# Patient Record
Sex: Male | Born: 1990 | Hispanic: Yes | Marital: Single | Smoking: Never smoker
Health system: Southern US, Community
[De-identification: ages and names within clinical notes are randomized; demographics above are authoritative.]

---

## 2012-05-17 ENCOUNTER — Encounter (HOSPITAL_COMMUNITY): Payer: Self-pay | Admitting: Emergency Medicine

## 2012-05-17 ENCOUNTER — Emergency Department (INDEPENDENT_AMBULATORY_CARE_PROVIDER_SITE_OTHER)
Admission: EM | Admit: 2012-05-17 | Discharge: 2012-05-17 | Disposition: A | Discharge status: Home / Self Care | Payer: BC Managed Care – PPO | Source: Home / Self Care

## 2012-05-17 DIAGNOSIS — L259 Unspecified contact dermatitis, unspecified cause: Secondary | ICD-10-CM

## 2012-05-17 DIAGNOSIS — R21 Rash and other nonspecific skin eruption: Secondary | ICD-10-CM

## 2012-05-17 MED ORDER — DIPHENHYDRAMINE HCL 25 MG PO CAPS: 25.0000 mg | ORAL_CAPSULE | Freq: Four times a day (QID) | ORAL | Status: AC | PRN

## 2012-05-17 MED ORDER — TRIAMCINOLONE ACETONIDE 40 MG/ML IJ SUSP
60.0000 mg | Freq: Once | INTRAMUSCULAR | Status: AC
Start: 2012-05-17 — End: 2012-05-17
  Administered 2012-05-17: 60 mg via INTRAMUSCULAR

## 2012-05-17 MED ORDER — TRIAMCINOLONE ACETONIDE 40 MG/ML IJ SUSP
INTRAMUSCULAR | Status: AC
  Filled 2012-05-17: qty 5

## 2012-05-17 MED ORDER — TRIAMCINOLONE ACETONIDE 0.1 % EX CREA: TOPICAL_CREAM | Freq: Two times a day (BID) | CUTANEOUS | Status: AC

## 2012-05-17 NOTE — ED Notes (Signed)
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 Has had itchy rash x 5 days. Rash on arms and hands.

## 2012-05-17 NOTE — ED Provider Notes (Signed)
History     CSN: [id number]  Arrival date & time 05/17/12  1500   None     Chief Complaint  Patient presents with  . Rash    (Consider location/radiation/quality/duration/timing/severity/associated sxs/prior treatment) Patient is a 21 y.o. male presenting with rash. The history is provided by the patient.  Rash   This patient complains of a pruritic rash.  Location: bilateral forearms  Onset: 1 wk ago   Course: unchanged Self-treated with: caladyrl           Improvement with treatment: no  History Itching: yes  Tenderness: no  New medications/antibiotics: no  Pet exposure: no  Recent travel or tropical exposure: cleaning yard 1.5 week ago New soaps, shampoos, detergent, clothing: no Tick/insect exposure: no   Red Flags Feeling ill: no Fever:no Facial/tongue swelling/difficulty breathing:  no  Diabetic or immunocompromised: no       History reviewed. No pertinent past medical history.  History reviewed. No pertinent past surgical history.  No family history on file.  History  Substance Use Topics  . Smoking status: Never Smoker   . Smokeless tobacco: Not on file  . Alcohol Use: No      Review of Systems  Constitutional: Negative.   HENT: Negative.   Respiratory: Negative.   Cardiovascular: Negative.   Skin: Positive for rash. Negative for color change, pallor and wound.    Allergies  Review of patient's allergies indicates no known allergies.  Home Medications  No current outpatient prescriptions on file.  BP 122/60  Pulse 64  Temp 98.3 F (36.8 C) (Oral)  Resp 16  SpO2 100%  Physical Exam  Nursing note and vitals reviewed. Constitutional: He is oriented to person, place, and time. Vital signs are normal. He appears well-developed and well-nourished. He is active and cooperative.  HENT:  Head: Normocephalic.  Mouth/Throat: Oropharynx is clear and moist.  Eyes: Conjunctivae are normal. Pupils are equal, round, and reactive to  light. No scleral icterus.  Neck: Trachea normal and normal range of motion. Neck supple.  Cardiovascular: Normal rate and regular rhythm.   Pulmonary/Chest: Effort normal and breath sounds normal. No respiratory distress. He has no wheezes.  Neurological: He is alert and oriented to person, place, and time. No cranial nerve deficit or sensory deficit.  Skin: Skin is warm and dry. Rash noted. Rash is papular, pustular and vesicular.       Bilateral forearm  Psychiatric: He has a normal mood and affect. His speech is normal and behavior is normal. Judgment and thought content normal. Cognition and memory are normal.    ED Course  Procedures (including critical care time)  Labs Reviewed - No data to display No results found.   No diagnosis found.    MDM  Cool showers; avoid heat, sunlight and anything that makes condition worse.  Began Benadryl for at least seven days.  Use triamcinolone cream to itchy areas.  RTC if symptoms do not improve or begin to have problems swallowing, breathing or significant change in condition.        Johnsie Kindred, NP 05/17/12 1605

## 2012-05-17 NOTE — ED Provider Notes (Signed)
Medical screening examination/treatment/procedure(s) were performed by non-physician practitioner and as supervising physician I was immediately available for consultation/collaboration.  Leslee Home, M.D.   Reuben Likes, MD 05/17/12 [phone]

## 2015-01-18 ENCOUNTER — Encounter (HOSPITAL_COMMUNITY): Payer: Self-pay | Admitting: Emergency Medicine

## 2015-01-18 ENCOUNTER — Emergency Department (HOSPITAL_COMMUNITY)
Admission: EM | Admit: 2015-01-18 | Discharge: 2015-01-18 | Disposition: A | Discharge status: Home / Self Care | Payer: Self-pay | Attending: Emergency Medicine | Admitting: Emergency Medicine

## 2015-01-18 DIAGNOSIS — S91119A Laceration without foreign body of unspecified toe without damage to nail, initial encounter: Secondary | ICD-10-CM

## 2015-01-18 DIAGNOSIS — S91115A Laceration without foreign body of left lesser toe(s) without damage to nail, initial encounter: Secondary | ICD-10-CM | POA: Insufficient documentation

## 2015-01-18 DIAGNOSIS — Y9389 Activity, other specified: Secondary | ICD-10-CM | POA: Insufficient documentation

## 2015-01-18 DIAGNOSIS — Y998 Other external cause status: Secondary | ICD-10-CM | POA: Insufficient documentation

## 2015-01-18 DIAGNOSIS — Y9289 Other specified places as the place of occurrence of the external cause: Secondary | ICD-10-CM | POA: Insufficient documentation

## 2015-01-18 DIAGNOSIS — W293XXA Contact with powered garden and outdoor hand tools and machinery, initial encounter: Secondary | ICD-10-CM | POA: Insufficient documentation

## 2015-01-18 MED ORDER — LIDOCAINE HCL (PF) 1 % IJ SOLN
5.0000 mL | Freq: Once | INTRAMUSCULAR | Status: AC
  Administered 2015-01-18: 5 mL
  Filled 2015-01-18: qty 5

## 2015-01-18 NOTE — ED Notes (Signed)
Patient states was using a skill saw and accidentally caught his left foot on top of the 2nd toe.   Laceration to same.   Bleeding controlled at this time.

## 2015-01-18 NOTE — ED Provider Notes (Signed)
CSN: [id number]     Arrival date & time 01/18/15  1018 History  This chart was scribed for non-physician practitioner, Renne Crigler, PA-C working with Samuel Jester, DO by Gwenyth Ober, ED scribe. This patient was seen in room TR11C/TR11C and the patient's care was started at 10:33 AM   Chief Complaint  Patient presents with  . Extremity Laceration   The history is provided by the patient. No language interpreter was used.   HPI Comments: Oscar Lee is a 24 y.o. male who presents to the Emergency Department complaining of a subcutaneous laceration to his left 2nd toe, with controlled bleeding, that occurred 1 hour ago. Pt reports that he was using a skill saw when it went through his sneakers and hit his toe. Pt cleaned the wound with water. His last tetanus was within the last 5 years. Pt denies numbness and tingling as associated symptoms.  History reviewed. No pertinent past medical history. History reviewed. No pertinent past surgical history. No family history on file. History  Substance Use Topics  . Smoking status: Never Smoker   . Smokeless tobacco: Not on file  . Alcohol Use: Yes     Comment: socially    Review of Systems  Constitutional: Negative for activity change.  Musculoskeletal: Positive for arthralgias. Negative for back pain, joint swelling, gait problem and neck pain.  Skin: Positive for wound.  Neurological: Negative for weakness and numbness.   Allergies  Review of patient's allergies indicates no known allergies.  Home Medications   Prior to Admission medications   Medication Sig Start Date End Date Taking? Authorizing Provider  diphenhydrAMINE (BENADRYL) 25 mg capsule Take 1 capsule (25 mg total) by mouth every 6 (six) hours as needed for itching. 05/17/12 05/27/12  Johnsie Kindred, NP   BP 116/53 mmHg  Pulse 81  Temp(Src) 98.8 F (37.1 C) (Oral)  Resp 18  Ht  (1.651 m)  Wt 174 lb (78.926 kg)  BMI 28.96 kg/m2  SpO2 100% Physical  Exam  Constitutional: He appears well-developed and well-nourished. No distress.  HENT:  Head: Normocephalic and atraumatic.  Eyes: Conjunctivae and EOM are normal.  Neck: Normal range of motion. Neck supple. No tracheal deviation present.  Cardiovascular: Normal rate and normal pulses.   Pulmonary/Chest: Effort normal. No respiratory distress.  Musculoskeletal: He exhibits tenderness. He exhibits no edema.  Neurological: He is alert. No sensory deficit.  Motor, sensation, and vascular distal to the injury is fully intact. Patient with 5 out of 5 strength and normal active flexion and extension of all toes on the left foot.  Skin: Skin is warm and dry.  2 cm full-thickness laceration to the left second toe, running lengthwise, from between the MTP and PIP to between the PIP and DIP. Wound is on the medial aspect of the toe. It does extend to the muscle but does not appear to disrupt the muscular layer. No complete tendon injury noted. Wound appears clean. No foreign bodies noted, seen or palpated.  Psychiatric: He has a normal mood and affect. His behavior is normal.  Nursing note and vitals reviewed.   ED Course  Procedures   DIAGNOSTIC STUDIES: Oxygen Saturation is 100% on RA, normal by my interpretation.    COORDINATION OF CARE: 10:38 AM Discussed treatment plan with pt which includes suture repair. Pt agreed to plan.  10:45 AM LACERATION REPAIR Performed by: Renne Crigler, PA-C Consent: Verbal consent obtained. Risks and benefits: risks, benefits and alternatives were discussed Patient identity confirmed:  provided demographic data Time out performed prior to procedure Prepped and Draped in normal sterile fashion Wound explored Laceration Location: Left 2nd toe Laceration Length: 2 cm No Foreign Bodies seen or palpated Anesthesia: local infiltration Local anesthetic: lidocaine 1% w/o epinephrine Anesthetic total: 4 ml Irrigation method: syringe Amount of cleaning:  standard Skin closure: Tight with edges approximated Number of sutures or staples: 5 Technique: 5-O Ethilon  Patient tolerance: Patient tolerated the procedure well with no immediate complications.  11:00 AM Discussed wound care with pt. Advised pt to return with purulent drainage, increased redness or swelling. Pt to return in 10 days for suture removal.    Labs Review Labs Reviewed - No data to display  Imaging Review No results found.   EKG Interpretation None       Vital signs reviewed and are as follows: Filed Vitals:   01/18/15 1026  BP: 116/53  Pulse: 81  Temp: 98.8 F (37.1 C)  Resp: 18      MDM   Final diagnoses:  Laceration of toe, initial encounter   Toe laceration, cleaned, repaired. Tetanus UTD. No neuro, vascular, or muscular disruption suspected.   I personally performed the services described in this documentation, which was scribed in my presence. The recorded information has been reviewed and is accurate.    Renne CriglerJoshua Rai Severns, PA-C 01/18/15 1132  Samuel JesterKathleen McManus, DO 01/19/15 [phone]

## 2015-01-18 NOTE — Discharge Instructions (Signed)
Please read and follow all provided instructions.  Your diagnoses today include:  1. Laceration of toe, initial encounter     Tests performed today include:  Vital signs. See below for your results today.   Medications prescribed:   None  Take any prescribed medications only as directed.   Home care instructions:  Follow any educational materials and wound care instructions contained in this packet.   Keep affected area above the level of your heart when possible to minimize swelling. Wash area gently twice a day with warm soapy water. Do not apply alcohol or hydrogen peroxide. Cover the area if it draining or weeping.   Follow-up instructions: Suture Removal: Return to the Emergency Department or see your primary care care doctor in 10 days for a recheck of your wound and removal of your sutures or staples.    Return instructions:  Return to the Emergency Department if you have:  Fever  Worsening pain  Worsening swelling of the wound  Pus draining from the wound  Redness of the skin that moves away from the wound, especially if it streaks away from the affected area   Any other emergent concerns  Your vital signs today were: BP 116/53 mmHg   Pulse 81   Temp(Src) 98.8 F (37.1 C) (Oral)   Resp 18   Ht 5\' 5"  (1.651 m)   Wt 174 lb (78.926 kg)   BMI 28.96 kg/m2   SpO2 100% If your blood pressure (BP) was elevated above 135/85 this visit, please have this repeated by your doctor within one month. --------------

## 2015-02-01 ENCOUNTER — Encounter (HOSPITAL_COMMUNITY): Payer: Self-pay | Admitting: Emergency Medicine

## 2015-02-01 ENCOUNTER — Emergency Department (HOSPITAL_COMMUNITY)
Admission: EM | Admit: 2015-02-01 | Discharge: 2015-02-01 | Disposition: A | Discharge status: Home / Self Care | Payer: Self-pay | Attending: Emergency Medicine | Admitting: Emergency Medicine

## 2015-02-01 DIAGNOSIS — Z4802 Encounter for removal of sutures: Secondary | ICD-10-CM | POA: Insufficient documentation

## 2015-02-01 DIAGNOSIS — M79675 Pain in left toe(s): Secondary | ICD-10-CM | POA: Insufficient documentation

## 2015-02-01 MED ORDER — BACITRACIN ZINC 500 UNIT/GM EX OINT: 1.0000 "application " | TOPICAL_OINTMENT | Freq: Two times a day (BID) | CUTANEOUS | Status: AC

## 2015-02-01 NOTE — Discharge Instructions (Signed)
Read the information below.  You may return to the Emergency Department at any time for worsening condition or any new symptoms that concern you.  If you develop redness, swelling, pus draining from the wound, or fevers greater than 100.4, return to the ER immediately for a recheck.     Suture Removal, Care After Refer to this sheet in the next few weeks. These instructions provide you with information on caring for yourself after your procedure. Your health care provider may also give you more specific instructions. Your treatment has been planned according to current medical practices, but problems sometimes occur. Call your health care provider if you have any problems or questions after your procedure. WHAT TO EXPECT AFTER THE PROCEDURE After your stitches (sutures) are removed, it is typical to have the following:  Some discomfort and swelling in the wound area.  Slight redness in the area. HOME CARE INSTRUCTIONS   If you have skin adhesive strips over the wound area, do not take the strips off. They will fall off on their own in a few days. If the strips remain in place after 14 days, you may remove them.  Change any bandages (dressings) at least once a day or as directed by your health care provider. If the bandage sticks, soak it off with warm, soapy water.  Apply cream or ointment only as directed by your health care provider. If using cream or ointment, wash the area with soap and water 2 times a day to remove all the cream or ointment. Rinse off the soap and pat the area dry with a clean towel.  Keep the wound area dry and clean. If the bandage becomes wet or dirty, or if it develops a bad smell, change it as soon as possible.  Continue to protect the wound from injury.  Use sunscreen when out in the sun. New scars become sunburned easily. SEEK MEDICAL CARE IF:  You have increasing redness, swelling, or pain in the wound.  You see pus coming from the wound.  You have a  fever.  You notice a bad smell coming from the wound or dressing.  Your wound breaks open (edges not staying together). Document Released: 06/16/2001 Document Revised: 07/12/2013 Document Reviewed: 05/03/2013 [facility]ExitCare Patient Information 2015 [place]. This information is not intended to replace advice given to you by your health care provider. Make sure you discuss any questions you have with your health care provider.    Emergency Department Resource Guide 1) Find a Doctor and Pay Out of Pocket Although you won't have to find out who is covered by your insurance plan, it is a good idea to ask around and get recommendations. You will then need to call the office and see if the doctor you have chosen will accept you as a new patient and what types of options they offer for patients who are self-pay. Some doctors offer discounts or will set up payment plans for their patients who do not have insurance, but you will need to ask so you aren't surprised when you get to your appointment.  2) Contact Your Local Health Department Not all health departments have doctors that can see patients for sick visits, but many do, so it is worth a call to see if yours does. If you don't know where your local health department is, you can check in your phone book. The CDC also has a tool to help you locate your state's health department, and many state websites also have  listings of all of their local health departments.  3) Find a Walk-in Clinic If your illness is not likely to be very severe or complicated, you may want to try a walk in clinic. These are popping up all over the country in pharmacies, drugstores, and shopping centers. They're usually staffed by nurse practitioners or physician assistants that have been trained to treat common illnesses and complaints. They're usually fairly quick and inexpensive. However, if you have serious medical issues or chronic medical problems, these are probably not  your best option.  No Primary Care Doctor: - Call Health Connect at  [phone] - they can help you locate a primary care doctor that  accepts your insurance, provides certain services, etc. - Physician Referral Service- [phone]  Chronic Pain Problems: Organization         Address  Phone   Notes  [facility]  [phone] Patients need to be referred by their primary care doctor.   Medication Assistance: Organization         Address  Phone   Notes  [facility] Roshun Klingensmith Medication [facility] [address] [place], [place] [address] [phone] --Must be a resident of [facility] -- Must have NO insurance coverage whatsoever (no Medicaid/ Medicare, etc.) -- The pt. MUST have a primary care doctor that directs their care regularly and follows them in the community   [facility]ist  [phone]   [employer]  [phone]    Agencies that provide inexpensive medical care: Organization         Address  Phone   Notes  [facility]  [phone]   [facility]    [phone]   [facility] [address] [place], [place] [address] [phone]   [facility] [address] [place] [place] [phone]   [facility]    [phone]   [facility]    [phone]   [facility]  [address], [place] Phone:  [phone], Fax:  [phone] Hours of Operation:  9 am - 6 pm, M-F.  Also accepts Medicaid/Medicare and self-pay.  [facility]  [address], [place] Phone: [phone], Fax: [phone]. Hours of Operation:  8:30 am - 5:30 pm, M-F.  Also accepts Medicaid and self-pay.  [facility] [place] [address], [place] Phone: [phone]   [facility] [address] Natasha Bence [place], [place] [phone], Ext. [phone] Mondays & Thursdays: 7-9 AM.  First  15 patients are seen on a first come, first serve basis.    Medicaid-accepting [facility] Providers:  Organization         Address  Phone   Notes  [facility] [address], [place] [phone] Also accepts self-pay patients.  [facility] [address] Laurell Josephs [place], [place]  [phone]   [facility] [address], [place] [phone]   [facility] [address] [place] [phone]   Renaye Rakers [address], [place]   [phone] Only accepts [place] patients after they have their name applied to their card.   Self-Pay (no insurance) in [facility]:  [employer]  Sickle Cell Patients, [facility] [address], [place] [phone]   °[facility] [address], [place] ([phone]) [phone]   °[facility] ° [address], [place] ([phone]) [phone]   °[facility]/Dr. Osei-Bonsu ° [address] [place] [address], [place] or [address], [place] [phone]) [phone] Phone number for both [place] and [place] locations is the same.  °[facility] [address], [place] ([phone]) [phone]   °[facility] [address] [place] [address], [place] or [address] ([phone]) [phone] °(336) [phone]   °[facility] [address], [place] (336) [phone], phone; [phone], fax Sees patients 1st and 3rd Saturday of every month.  Must not qualify for public or private insurance (i.e. Medicaid, Medicare, [place] Health Choice, Veterans' Benefits) • Household income should be no more than 200% of the poverty level •The clinic cannot treat you if you are pregnant or think you are pregnant • Sexually transmitted diseases are not treated at the clinic.  ° ° °Dental  Care: °Organization         Address  Phone  Notes  °[facility] [address], [place] [phone]) [phone] Accepts children up to age 21 who are enrolled in Medicaid or [place] Health Choice; pregnant women with a Medicaid card; and children who have applied for Medicaid or [place] Health Choice, but were declined, whose parents can pay a reduced fee at time of service.  °[facility] [place]  [address], [place] ([phone]) [phone] Accepts children up to age 21 who are enrolled in Medicaid or [place] Health Choice; pregnant women with a Medicaid card; and children who have applied for Medicaid or [place] Health Choice, but were declined, whose parents can pay a reduced fee at time of service.  °[facility] ° [address], [place] ([phone]) [phone] Patients are seen by appointment only. Walk-ins are not accepted. [facility] will see patients 18 years of age and older. °Monday - Tuesday (8am-5pm) °Most Wednesdays (8:30-5pm) °$30 per visit, cash only  °[facility] ° [address], [place] ([phone]) [phone] Patients are seen by appointment only. Walk-ins are not accepted. [facility] will see patients 18 years of age and older. °One Wednesday Evening (Monthly: Volunteer Based).  $30 per visit, cash only  °[facility]  ([phone] for adults; Children under age 4, call Graduate Pediatric Dentistry at [phone]. Children aged 4-14, please call [phone] to request a pediatric application. ° Dental services are provided in all areas of dental care including fillings, crowns and bridges, complete and partial dentures, implants, gum treatment, root canals, and extractions. Preventive care is also provided. Treatment is provided to both adults and children. °Patients are selected via a lottery and there is often a waiting list. °  °[facility] [address], °[place] ° (336) 763-8833 [email or web address].[email or web address] °  °[facility] [address], [place], [place] (336)[phone], Ext. [phone] Second and Fourth Thursday of each month, opens at 6:30 AM; Clinic ends at 9 AM.  Patients are seen on a first-come first-served basis, and a limited number are seen during each clinic.  ° °[facility] ° [address], [place], [place] ([phone]) [phone]   Eligibility   Requirements °You must have lived in [place], [place], or [place] counties for at least the last three months. °  You cannot be eligible for state or federal sponsored healthcare insurance, including Veterans Administration, Medicaid, or Medicare. °  You generally cannot be eligible for healthcare insurance through your employer.  °  How to apply: °Eligibility screenings are held every Tuesday and Wednesday afternoon from 1:00 pm until 4:00 pm. You do not need an appointment for the interview!  °[facility] [address], [place], [place] [phone]   °[facility]  336-342-8273   °[facility]  336-703-3100   °[facility]  336-570-6415   ° °Behavioral Health Resources in the Community: °Intensive Outpatient Programs °Organization         Address  Phone  Notes  °[facility] [address], [place], [place] [phone]   °[facility] [address], [place], [place] 336-832-9800   °[facility] [address], [place], [place] ° 336-882-2125   °[facility] [address],  °[place], [place] 1-800-853-5163 or 336-641-4981   °Substance Abuse Resources °Organization         Address  Phone  Notes  °[facility]  336-882-2125   °[facility]  336-784-9470   °[facility]  336-285-9073   °[facility]  336-845-3988   °Residential & Outpatient Substance Abuse Program  1-800-659-3381    °Psychological Services °Organization         Address  Phone  Notes  °[facility]  336- 832-9600   °[facility]  336- 378-7881   °[facility] [address], [place] 1-800-853-5163 or 336-641-4981   ° °Mobile Crisis Teams °Organization         Address  Phone  Notes  °[facility], Mobile Crisis Care Unit  1-877-626-1772   °Assertive °[facility] ° [address] [place], [place] 336-834-9664   °Sharon DeEsch [address] °[place] 336-554-5454   ° °Self-Help/Support Groups °Organization         Address  Phone             Notes  °[facility] [place] - variety of support groups  336- 373-1402 Call for more information  °Narcotics Anonymous (NA), [facility] [address], °[place]  2 meetings at this location  ° °Residential Treatment Programs °Organization         Address  Phone  Notes  °[facility] [address],    °[place]  1-866-801-8205   °[facility] ° [address], [place], [place] 704-293-8524   °[facility] [address], [place] 336-845-3988 Admissions: 8am-3pm M-F  °[facility] [address]    °[place], [place] 336-841-1104   °[facility] [address], [place], [place] 336-379-7146   °[facility] [address]  °[place], [place] 336-285-9073   °[facility] Intensive Outpatient [address], [place], [place] 336-852-3033   °[facility] [address]  °[place], [place] 1-877-615-2722 or 336-784-9470   °[facility] ([facility]) [address] [place], [place] 336-227-7417 Accepts Medicaid  °[facility] [address]  °[place]  1-800-659-3381 Substance Abuse/Addiction Treatment  ° °[place] [facility] °Organization           Address  Phone  Notes  °[facility]  (888) 581-9988   °Julie Brannon, PhD [address] [place], [place]   (336) 349-5553 or (336) 951-0000   °[facility]   [address] °[place], [place] (336) 349-4454   °[facility] [address], [place], [place] (336) 342-8316 Insurance/Medicaid/sponsorship through [facility]  °[facility] [address]                                    [place], [place] (336) 342-8316 Therapy/tele-psych/case  °[facility] [address]  ° [place], [place] (336) 349-2233    °Dr. Arfeen  (336) 349-4544   °[facility] [place] [facility]  [employer] [facility] 1) [address], [place] °2) [address], [place] °3)  [address], [place] (336) 349-3220 °(336) 342-7768 ° °(336) 342-8140   °[place] [facility] (336) 342-1394 or (336) 342-3537 (After Hours)    ° ° ° °

## 2015-02-01 NOTE — ED Notes (Signed)
Request suture removal- L 2nd toe.  States sutures have been in place x 2 weeks.

## 2015-02-01 NOTE — ED Provider Notes (Signed)
CSN: [id number]     Arrival date & time 02/01/15  2014 History  This chart was scribed for non-physician practitioner, Trixie Dredge, PA-C, working with Linwood Dibbles, MD, by Bronson Curb, ED Scribe. This patient was seen in room TR06C/TR06C and the patient's care was started at 8:58 PM.     Chief Complaint  Patient presents with  . Suture / Staple Removal    The history is provided by the patient. No language interpreter was used.     HPI Comments: Oscar Lee is a 24 y.o. male, with no significant medical history, who presents to the Emergency Department for a suture removal. Patient was seen 2 weeks ago, on 4/15, for a laceration  of left second toe that occurred when using a skill saw. He reports the wound is healing slowly, but without complications. He reports pain to the area that is painful only upon waking in the mornings. He also mentions itching, occasional swelling, in addition to bleeding every 2 days. Patient states he keeps the wound covered, but has not applied any topical creams and denies soaking the foot. He further denies left ankle pain, fever, chills, body aches, nausea, or vomiting.    History reviewed. No pertinent past medical history. History reviewed. No pertinent past surgical history. No family history on file. History  Substance Use Topics  . Smoking status: Never Smoker   . Smokeless tobacco: Not on file  . Alcohol Use: Yes     Comment: socially    Review of Systems  Constitutional: Negative for fever and chills.  Gastrointestinal: Negative for nausea and vomiting.  Musculoskeletal: Positive for myalgias. Negative for arthralgias.  Skin: Positive for wound (healing laceration). Negative for color change.  Allergic/Immunologic: Negative for immunocompromised state.  Neurological: Negative for weakness and numbness.  Hematological: Does not bruise/bleed easily.      Allergies  Review of patient's allergies indicates no known allergies.  Home  Medications   Prior to Admission medications   Medication Sig Start Date End Date Taking? Authorizing Provider  diphenhydrAMINE (BENADRYL) 25 mg capsule Take 1 capsule (25 mg total) by mouth every 6 (six) hours as needed for itching. 05/17/12 05/27/12  Johnsie Kindred, NP   Triage Vitals: BP 127/77 mmHg  Pulse 87  Temp(Src) 97.8 F (36.6 C) (Oral)  Resp 16  Ht  (1.626 m)  Wt 175 lb (79.379 kg)  BMI 30.02 kg/m2  SpO2 98%  Physical Exam  Constitutional: He appears well-developed and well-nourished. No distress.  HENT:  Head: Normocephalic and atraumatic.  Eyes: Conjunctivae are normal.  Neck: Neck supple.  Pulmonary/Chest: Effort normal.  Neurological: He is alert.  Skin: He is not diaphoretic. No erythema.  Maceration of the skin between the 1st and 2nd toe of the left foot. No erythema, edema, discharge, or warmth involving the laceration.  Psychiatric: He has a normal mood and affect. His behavior is normal.  Nursing note and vitals reviewed.   ED Course  Procedures (including critical care time)  DIAGNOSTIC STUDIES: Oxygen Saturation is 98% on room air, normal by my interpretation.    COORDINATION OF CARE: At 2102 Discussed treatment plan with patient which includes suture removal and ABX ointment. Patient agrees.   Labs Review Labs Reviewed - No data to display  Imaging Review No results found.   EKG Interpretation None     SUTURE REMOVAL Performed by: Trixie Dredge  Consent: Verbal consent obtained. Patient identity confirmed: provided demographic data Time out: Immediately prior to procedure  a "time out" was called to verify the correct patient, procedure, equipment, support staff and site/side marked as required.  Location details: left second toe  Wound Appearance: clean  Sutures/Staples Removed: 5  Facility: sutures placed in this facility Patient tolerance: Patient tolerated the procedure well with no immediate complications.    MDM    Final diagnoses:  Visit for suture removal    Afebrile, nontoxic patient with visit for suture removal.  No e/o wound infection.  Small part of intertriginous area is macerated, suspect overly moist from bandages.  Discussed wound care with the patient.   D/C home with wound care instructions, return precautions.   Discussed result, findings, treatment, and follow up  with patient.  Pt given return precautions.  Pt verbalizes understanding and agrees with plan.       I personally performed the services described in this documentation, which was scribed in my presence. The recorded information has been reviewed and is accurate.    Trixie Dredgemily Lynnex Fulp, PA-C 02/01/15 2115  Linwood DibblesJon Knapp, MD 02/02/15 [phone]

## 2015-02-01 NOTE — ED Notes (Signed)
Pt A&OX4, ambulatory at d/c with steady gait, NAD 

## 2017-12-09 ENCOUNTER — Emergency Department (HOSPITAL_COMMUNITY)
Admission: EM | Admit: 2017-12-09 | Discharge: 2017-12-09 | Disposition: A | Discharge status: Home / Self Care | Payer: Self-pay | Attending: Emergency Medicine | Admitting: Emergency Medicine

## 2017-12-09 ENCOUNTER — Emergency Department (HOSPITAL_COMMUNITY): Payer: Self-pay

## 2017-12-09 ENCOUNTER — Other Ambulatory Visit: Payer: Self-pay

## 2017-12-09 ENCOUNTER — Encounter (HOSPITAL_COMMUNITY): Payer: Self-pay | Admitting: Emergency Medicine

## 2017-12-09 DIAGNOSIS — Z79899 Other long term (current) drug therapy: Secondary | ICD-10-CM | POA: Insufficient documentation

## 2017-12-09 DIAGNOSIS — Y990 Civilian activity done for income or pay: Secondary | ICD-10-CM | POA: Insufficient documentation

## 2017-12-09 DIAGNOSIS — S76112A Strain of left quadriceps muscle, fascia and tendon, initial encounter: Secondary | ICD-10-CM | POA: Insufficient documentation

## 2017-12-09 DIAGNOSIS — X503XXA Overexertion from repetitive movements, initial encounter: Secondary | ICD-10-CM | POA: Insufficient documentation

## 2017-12-09 DIAGNOSIS — Y9389 Activity, other specified: Secondary | ICD-10-CM | POA: Insufficient documentation

## 2017-12-09 DIAGNOSIS — M25562 Pain in left knee: Secondary | ICD-10-CM | POA: Insufficient documentation

## 2017-12-09 DIAGNOSIS — Y9289 Other specified places as the place of occurrence of the external cause: Secondary | ICD-10-CM | POA: Insufficient documentation

## 2017-12-09 MED ORDER — IBUPROFEN 600 MG PO TABS: 600.0000 mg | ORAL_TABLET | Freq: Four times a day (QID) | ORAL | 0 refills | Status: AC | PRN

## 2017-12-09 MED ORDER — DICLOFENAC SODIUM 1 % TD GEL: 2.0000 g | Freq: Four times a day (QID) | TRANSDERMAL | 0 refills | Status: AC

## 2017-12-09 NOTE — ED Notes (Signed)
See EDP secondary assessment.  

## 2017-12-09 NOTE — ED Triage Notes (Signed)
Pt c/o 9/10 left knee pain for the past few days getting worse today, pt denies any injury.

## 2017-12-09 NOTE — ED Notes (Signed)
ACE wrap applied to L knee. Patient tolerated well.

## 2017-12-09 NOTE — ED Provider Notes (Signed)
[facility]   CSN: [id number] Arrival date & time: 12/09/17  [id number]     History   Chief Complaint Chief Complaint  Patient presents with  . Knee Pain    HPI Oscar Lee is a 27 y.o. male with no past medical history presenting with progressive onset sharp left upper knee pain radiating up into his anterior quads and worsening over the last couple of days.  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  He typically uses his right knee to hit the kick board and on rest on his body weight on his left knee.  He has tried icy hot and Advil 2 days ago with modest relief.  He reports that this morning he was having a hard time sleeping due to the discomfort and decided to come in to be evaluated.  He denies any direct injury to the knee or trauma.  He denies any swelling, numbness, redness, fever. Patient does not currently have a primary care provider.  HPI  History reviewed. No pertinent past medical history.  There are no active problems to display for this patient.   History reviewed. No pertinent surgical history.     Home Medications    Prior to Admission medications   Medication Sig Start Date End Date Taking? Authorizing Provider  bacitracin ointment Apply 1 application topically 2 (two) times daily. 02/01/15   Trixie DredgeWest, Emily, PA-C  diclofenac sodium (VOLTAREN) 1 % GEL Apply 2 g topically 4 (four) times daily. 12/09/17   Georgiana ShoreMitchell, Jessica B, PA-C  diphenhydrAMINE (BENADRYL) 25 mg capsule Take 1 capsule (25 mg total) by mouth every 6 (six) hours as needed for itching. 05/17/12 05/27/12  Chatten, Katherine Bassetarmen L, NP  ibuprofen (ADVIL,MOTRIN) 600 MG tablet Take 1 tablet (600 mg total) by mouth every 6 (six) hours as needed. 12/09/17   Georgiana ShoreMitchell, Jessica B, PA-C    Family History History reviewed. No pertinent family history.  Social History Social History   Tobacco Use  . Smoking status:  Never Smoker  . Smokeless tobacco: Never Used  Substance Use Topics  . Alcohol use: Yes    Comment: socially  . Drug use: No     Allergies   Patient has no known allergies.   Review of Systems Review of Systems  Constitutional: Negative for diaphoresis and fever.  Respiratory: Negative for cough, choking, chest tightness, shortness of breath, wheezing and stridor.   Cardiovascular: Negative for chest pain, palpitations and leg swelling.  Gastrointestinal: Negative for nausea and vomiting.  Genitourinary: Negative for difficulty urinating, dysuria and hematuria.  Musculoskeletal: Positive for arthralgias and myalgias. Negative for back pain, gait problem, joint swelling, neck pain and neck stiffness.  Skin: Negative for color change, pallor and wound.  Neurological: Negative for weakness and numbness.     Physical Exam Updated Vital Signs BP 121/70 (BP Location: Right Arm)   Pulse 73   Temp 98.2 F (36.8 C) (Oral)   Resp 16   Ht 5\' 4"  (1.626 m)   Wt 83.5 kg (184 lb)   SpO2 98%   BMI 31.58 kg/m   Physical Exam  Constitutional: He appears well-developed and well-nourished. No distress.  Well-appearing, nontoxic afebrile sitting comfortably in bed no acute distress.  HENT:  Head: Normocephalic and atraumatic.  Eyes: Conjunctivae are normal.  Neck: Neck supple.  Cardiovascular: Normal rate, regular rhythm, normal heart sounds and intact distal pulses.  Pulmonary/Chest: Effort normal and breath  sounds normal. No stridor. No respiratory distress. He has no wheezes. He has no rales.  Musculoskeletal: Normal range of motion. He exhibits no edema, tenderness or deformity.  Patient with full range of motion of the knee, no problems with weightbearing or ambulating.  No erythema, edema or warmth.  Neurological: He is alert. No sensory deficit. He exhibits normal muscle tone.  5 out of 5 strength to flexion/ extension at the knee and plantar flexion dorsiflexion.  Sensation  intact. Vascularly intact.  Normal stance and gait.  Skin: Skin is warm and dry. No rash noted. He is not diaphoretic. No erythema. No pallor.  Psychiatric: He has a normal mood and affect.  Nursing note and vitals reviewed.    ED Treatments / Results  Labs (all labs ordered are listed, but only abnormal results are displayed) Labs Reviewed - No data to display  EKG  EKG Interpretation None       Radiology Dg Knee 2 Views Left  Result Date: 12/09/2017 CLINICAL DATA:  Left knee pain for 3 days.  No known injury. EXAM: LEFT KNEE - 2 VIEW COMPARISON:  None. FINDINGS: No evidence of fracture, dislocation, or joint effusion. No evidence of arthropathy or other focal bone abnormality. Soft tissues are unremarkable. IMPRESSION: Negative. Electronically Signed   By: Myles Rosenthal M.D.   On: 12/09/2017 07:08    Procedures Procedures (including critical care time)  Medications Ordered in ED Medications - No data to display   Initial Impression / Assessment and Plan / ED Course  I have reviewed the triage vital signs and the nursing notes.  Pertinent labs & imaging results that were available during my care of the patient were reviewed by me and considered in my medical decision making (see chart for details).     Presented with a few days of progressive onset left knee pain.  The pain is not reproducible on palpation and is deep, located over the patellar tendon and radiating into the quads.  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 Possibly patellar tendon strain. Plain films negative for acute fracture, dislocation or effusion.  Reassuring exam, full range of motion, no swelling, erythema, warmth, patient is afebrile nontoxic with normal vital signs.  Will discharge home with symptomatic relief and close follow-up with PCP.  Advised rice protocol and Ace wrap to help support the quadriceps.  Discussed return precautions,  patient understood and agrees with discharge plan.  Final Clinical Impressions(s) / ED Diagnoses   Final diagnoses:  Acute pain of left knee  Strain of left quadriceps, initial encounter    ED Discharge Orders        Ordered    diclofenac sodium (VOLTAREN) 1 % GEL  4 times daily     12/09/17 0945    ibuprofen (ADVIL,MOTRIN) 600 MG tablet  Every 6 hours PRN     12/09/17 0945       Georgiana Shore, PA-C 12/09/17 4098    Cathren Laine, MD 12/09/17 1353

## 2017-12-09 NOTE — Discharge Instructions (Signed)
As discussed, make sure to use knee pads when at work and wear an Ace wrap to help support your upper thigh.  Apply diclofenac gel as needed and take ibuprofen every 6 hours for pain. Try to get some rest and protect the area from further injury.  Follow up with your primary care provider if symptoms beyond a week.

## 2018-01-14 ENCOUNTER — Encounter (HOSPITAL_COMMUNITY): Payer: Self-pay

## 2018-01-14 ENCOUNTER — Emergency Department (HOSPITAL_COMMUNITY): Payer: Self-pay

## 2018-01-14 ENCOUNTER — Emergency Department (HOSPITAL_COMMUNITY)
Admission: EM | Admit: 2018-01-14 | Discharge: 2018-01-15 | Disposition: A | Discharge status: Home / Self Care | Payer: Self-pay | Attending: Emergency Medicine | Admitting: Emergency Medicine

## 2018-01-14 ENCOUNTER — Other Ambulatory Visit: Payer: Self-pay

## 2018-01-14 DIAGNOSIS — R55 Syncope and collapse: Secondary | ICD-10-CM | POA: Insufficient documentation

## 2018-01-14 DIAGNOSIS — Z79899 Other long term (current) drug therapy: Secondary | ICD-10-CM | POA: Insufficient documentation

## 2018-01-14 LAB — CBC
HCT: 43.3 % (ref 39.0–52.0)
Hemoglobin: 15.7 g/dL (ref 13.0–17.0)
MCH: 30.1 pg (ref 26.0–34.0)
MCHC: 36.3 g/dL — AB (ref 30.0–36.0)
MCV: 83.1 fL (ref 78.0–100.0)
Platelets: 242 10*3/uL (ref 150–400)
RBC: 5.21 MIL/uL (ref 4.22–5.81)
RDW: 13.1 % (ref 11.5–15.5)
WBC: 12.2 10*3/uL — ABNORMAL HIGH (ref 4.0–10.5)

## 2018-01-14 LAB — BASIC METABOLIC PANEL
ANION GAP: 9 (ref 5–15)
BUN: 15 mg/dL (ref 6–20)
CALCIUM: 8.9 mg/dL (ref 8.9–10.3)
CHLORIDE: 104 mmol/L (ref 101–111)
CO2: 23 mmol/L (ref 22–32)
Creatinine, Ser: 1.22 mg/dL (ref 0.61–1.24)
GFR calc Af Amer: >60 mL/min (ref >60)
GFR calc non Af Amer: >60 mL/min (ref >60)
GLUCOSE: 100 mg/dL — AB (ref 65–99)
Potassium: 3.7 mmol/L (ref 3.5–5.1)
Sodium: 136 mmol/L (ref 135–145)

## 2018-01-14 LAB — I-STAT TROPONIN, ED: TROPONIN I, POC: 0 ng/mL (ref 0.00–0.08)

## 2018-01-14 NOTE — ED Triage Notes (Signed)
Pt endorses driving home from work and became suddenly dizzy, began shaking all over, and having left sided chest pain with left arm pain. Denies n/v.

## 2018-01-14 NOTE — ED Provider Notes (Signed)
[facility] EMERGENCY DEPARTMENT Provider Note   CSN: [id number] Arrival date & time: 01/14/18  1820     History   Chief Complaint Chief Complaint  Patient presents with  . Near Syncope  . Chest Pain    HPI Oscar Lee is a 27 y.o. male.  27 yo M with a chief complaint of a near syncopal event.  The patient was driving down the road after leaving his work when he felt uncomfortable he started seeing spots and had to pull his car over.  He started hyperventilating and had pressure to the left side of his chest and down his left arm.  He called for a ride and they took him home.  When he got home he decided to come to the emergency department.  He is no longer having the symptoms.  Denies any exertional chest pain or shortness of breath.  Denies abdominal pain vomiting or diarrhea.  He denies hemoptysis leg swelling recent surgery or hospitalization.  Denies testosterone use.  He tells me that now he has some low abdominal discomfort.  He also has had a lump on his testicle for about a year and a half.  has not yet seen a provider for this.  The history is provided by the patient.  Illness  This is a new problem. The current episode started less than 1 hour ago. The problem occurs constantly. The problem has not changed since onset.Associated symptoms include chest pain and shortness of breath. Pertinent negatives include no abdominal pain and no headaches. Nothing aggravates the symptoms. Nothing relieves the symptoms. He has tried nothing for the symptoms. The treatment provided no relief.    History reviewed. No pertinent past medical history.  There are no active problems to display for this patient.   History reviewed. No pertinent surgical history.      Home Medications    Prior to Admission medications   Medication Sig Start Date End Date Taking? Authorizing Provider  bacitracin ointment Apply 1 application topically 2 (two) times daily.  02/01/15   Trixie Dredge, PA-C  diclofenac sodium (VOLTAREN) 1 % GEL Apply 2 g topically 4 (four) times daily. 12/09/17   Georgiana Shore, PA-C  diphenhydrAMINE (BENADRYL) 25 mg capsule Take 1 capsule (25 mg total) by mouth every 6 (six) hours as needed for itching. 05/17/12 05/27/12  Chatten, Katherine Basset, NP  ibuprofen (ADVIL,MOTRIN) 600 MG tablet Take 1 tablet (600 mg total) by mouth every 6 (six) hours as needed. 12/09/17   Georgiana Shore PA-C    Family History History reviewed. No pertinent family history.  Social History Social History   Tobacco Use  . Smoking status: Never Smoker  . Smokeless tobacco: Never Used  Substance Use Topics  . Alcohol use: Yes    Comment: socially  . Drug use: No     Allergies   Patient has no known allergies.   Review of Systems Review of Systems  Constitutional: Negative for chills and fever.  HENT: Negative for congestion and facial swelling.   Eyes: Negative for discharge and visual disturbance.  Respiratory: Positive for shortness of breath.   Cardiovascular: Positive for chest pain. Negative for palpitations.  Gastrointestinal: Negative for abdominal pain, diarrhea and vomiting.  Musculoskeletal: Negative for arthralgias and myalgias.  Skin: Negative for color change and rash.  Neurological: Positive for syncope (near). Negative for tremors and headaches.  Psychiatric/Behavioral: Negative for confusion and dysphoric mood.     Physical Exam Updated Vital Signs  BP 130/78 (BP Location: Right Arm)   Pulse 76   Temp 98.5 F (36.9 C) (Oral)   Resp 18   Ht 5\' 4"  (1.626 m)   Wt 83.9 kg (185 lb)   SpO2 99%   BMI 31.76 kg/m   Physical Exam  Constitutional: He is oriented to person, place, and time. He appears well-developed and well-nourished.  HENT:  Head: Normocephalic and atraumatic.  Eyes: Pupils are equal, round, and reactive to light. EOM are normal.  Neck: Normal range of motion. Neck supple. No JVD present.    Cardiovascular: Normal rate and regular rhythm. Exam reveals no gallop and no friction rub.  No murmur heard. Pulmonary/Chest: No respiratory distress. He has no wheezes.  Abdominal: He exhibits no distension and no mass. There is no tenderness. There is no rebound and no guarding.  Musculoskeletal: Normal range of motion.  Neurological: He is alert and oriented to person, place, and time.  Skin: No rash noted. No pallor.  Psychiatric: He has a normal mood and affect. His behavior is normal.  Nursing note and vitals reviewed.    ED Treatments / Results  Labs (all labs ordered are listed, but only abnormal results are displayed) Labs Reviewed  BASIC METABOLIC PANEL - Abnormal; Notable for the following components:      Result Value   Glucose, Bld 100 (*)    All other components within normal limits  CBC - Abnormal; Notable for the following components:   WBC 12.2 (*)    MCHC 36.3 (*)    All other components within normal limits  I-STAT TROPONIN, ED    EKG EKG Interpretation  Date/Time:  Friday January 14 2018 18:25:59 EDT Ventricular Rate:  99 PR Interval:  164 QRS Duration: 108 QT Interval:  350 QTC Calculation: 449 R Axis:   49 Text Interpretation:  Normal sinus rhythm with sinus arrhythmia Normal ECG No significant change since last tracing Confirmed by Melene PlanFloyd, Aiyden Lauderback [phone]([id number]) on 01/14/2018 11:30:34 PM   Radiology Dg Chest 2 View  Result Date: 01/14/2018 CLINICAL DATA:  Chest pain EXAM: CHEST - 2 VIEW COMPARISON:  None. FINDINGS: The heart size and mediastinal contours are within normal limits. Both lungs are clear. The visualized skeletal structures are unremarkable. IMPRESSION: No active cardiopulmonary disease. Electronically Signed   By: Jasmine PangKim  Fujinaga M.D.   On: 01/14/2018 18:53    Procedures Procedures (including critical care time)  Medications Ordered in ED Medications - No data to display   Initial Impression / Assessment and Plan / ED Course  I have reviewed  the triage vital signs and the nursing notes.  Pertinent labs & imaging results that were available during my care of the patient were reviewed by me and considered in my medical decision making (see chart for details).     27 yo M with a chief complaint of a near syncopal event.  Sounds vasovagal by history.  He is PERC negative.  Sounds completely atypical of ACS.  Labs done in triage negative troponin BMP and CBC reassuring EKG with no signs of Brugada or Wolff-Parkinson-White or prolonged QT.  Chest x-ray unremarkable.  Will discharge the patient home.  PCP follow-up.  As the patient has been complaining about a lump to his testicle and has been having trouble seeing his PCP, will have him follow-up with urology.  11:47 PM:  I have discussed the diagnosis/risks/treatment options with the patient and family and believe the pt to be eligible for discharge home to follow-up with  PCP, Urology. We also discussed returning to the ED immediately if new or worsening sx occur. We discussed the sx which are most concerning (e.g., sudden worsening pain, fever, inability to tolerate by mouth) that necessitate immediate return. Medications administered to the patient during their visit and any new prescriptions provided to the patient are listed below.  Medications given during this visit Medications - No data to display   The patient appears reasonably screen and/or stabilized for discharge and I doubt any other medical condition or other [facility] requiring further screening, evaluation, or treatment in the ED at this time prior to discharge.     Final Clinical Impressions(s) / ED Diagnoses   Final diagnoses:  Near syncope    ED Discharge Orders    None       Melene Plan, DO 01/14/18 2347

## 2018-01-14 NOTE — Discharge Instructions (Signed)
Eat and drink well for the next couple of days.  Return for worsening symptoms.  °

## 2018-02-10 ENCOUNTER — Other Ambulatory Visit: Payer: Self-pay

## 2018-02-10 DIAGNOSIS — R0789 Other chest pain: Secondary | ICD-10-CM | POA: Insufficient documentation

## 2018-02-10 DIAGNOSIS — R51 Headache: Secondary | ICD-10-CM | POA: Insufficient documentation

## 2018-02-10 NOTE — ED Triage Notes (Signed)
Pt reports headache and a cold feeling in chest today and feeling faint. Pt went to [facility] for similar chest pain a month ago.

## 2018-02-11 ENCOUNTER — Emergency Department (HOSPITAL_COMMUNITY): Payer: Self-pay

## 2018-02-11 ENCOUNTER — Emergency Department (HOSPITAL_COMMUNITY)
Admission: EM | Admit: 2018-02-11 | Discharge: 2018-02-11 | Disposition: A | Discharge status: Home / Self Care | Payer: Self-pay | Attending: Emergency Medicine | Admitting: Emergency Medicine

## 2018-02-11 DIAGNOSIS — R51 Headache: Secondary | ICD-10-CM

## 2018-02-11 DIAGNOSIS — R519 Headache, unspecified: Secondary | ICD-10-CM

## 2018-02-11 DIAGNOSIS — R0789 Other chest pain: Secondary | ICD-10-CM

## 2018-02-11 MED ORDER — DEXAMETHASONE SODIUM PHOSPHATE 10 MG/ML IJ SOLN
10.0000 mg | Freq: Once | INTRAMUSCULAR | Status: AC
  Administered 2018-02-11: 10 mg via INTRAMUSCULAR
  Filled 2018-02-11: qty 1

## 2018-02-11 MED ORDER — KETOROLAC TROMETHAMINE 30 MG/ML IJ SOLN
60.0000 mg | Freq: Once | INTRAMUSCULAR | Status: AC
  Administered 2018-02-11: 60 mg via INTRAMUSCULAR
  Filled 2018-02-11: qty 2

## 2018-02-11 NOTE — ED Notes (Signed)
Bed: [facility]ALD Expected date: 02/10/18 Expected time: 11:36 PM Means of arrival:  Comments: Ready but no sheet on it

## 2018-02-11 NOTE — ED Provider Notes (Signed)
TIME SEEN: 1:21 AM  CHIEF COMPLAINT: Headache, chest pain  HPI: Patient is a 27 year old male with no significant past medical history who presents to the emergency department with 2 weeks of intermittent diffuse generalized throbbing headaches.  States he takes 600 mg ibuprofen and the headache will go away but then comes back.  No head injury.  Not on blood thinners.  No numbness, tingling or focal weakness.  No fever, neck pain or neck stiffness.  No photophobia.  No vomiting.  Also complains of chest pain which she describes as feeling like there is a "cold" feeling in the left side of his chest.  No cough.  No shortness of breath.  No history of PE or DVT.  No history of CAD.  ROS: See HPI Constitutional: no fever  Eyes: no drainage  ENT: no runny nose   Cardiovascular:  chest pain  Resp: no SOB  GI: no vomiting GU: no dysuria Integumentary: no rash  Allergy: no hives  Musculoskeletal: no leg swelling  Neurological: no slurred speech ROS otherwise negative  PAST MEDICAL HISTORY/PAST SURGICAL HISTORY:  No past medical history on file.  MEDICATIONS:  Prior to Admission medications   Medication Sig Start Date End Date Taking? Authorizing Provider  bacitracin ointment Apply 1 application topically 2 (two) times daily. 02/01/15   Trixie Dredge, PA-C  diclofenac sodium (VOLTAREN) 1 % GEL Apply 2 g topically 4 (four) times daily. 12/09/17   Georgiana Shore, PA-C  diphenhydrAMINE (BENADRYL) 25 mg capsule Take 1 capsule (25 mg total) by mouth every 6 (six) hours as needed for itching. 05/17/12 05/27/12  Chatten, Katherine Basset, NP  ibuprofen (ADVIL,MOTRIN) 600 MG tablet Take 1 tablet (600 mg total) by mouth every 6 (six) hours as needed. 12/09/17   Georgiana Shore, PA-C    ALLERGIES:  No Known Allergies  SOCIAL HISTORY:  Social History   Tobacco Use  . Smoking status: Never Smoker  . Smokeless tobacco: Never Used  Substance Use Topics  . Alcohol use: Yes    Comment: socially     FAMILY HISTORY: No family history on file.  EXAM: BP 122/79 (BP Location: Left Arm)   Pulse (!) 56   Temp 98.2 F (36.8 C) (Oral)   Resp 18   Ht  (1.6 m)   Wt 86.2 kg (190 lb)   SpO2 100%   BMI 33.66 kg/m  CONSTITUTIONAL: Alert and oriented and responds appropriately to questions. Well-appearing; well-nourished HEAD: Normocephalic EYES: Conjunctivae clear, pupils appear equal, EOMI ENT: normal nose; moist mucous membranes NECK: Supple, no meningismus, no nuchal rigidity, no LAD  CARD: RRR; S1 and S2 appreciated; no murmurs, no clicks, no rubs, no gallops RESP: Normal chest excursion without splinting or tachypnea; breath sounds clear and equal bilaterally; no wheezes, no rhonchi, no rales, no hypoxia or respiratory distress, speaking full sentences ABD/GI: Normal bowel sounds; non-distended; soft, non-tender, no rebound, no guarding, no peritoneal signs, no hepatosplenomegaly BACK:  The back appears normal and is non-tender to palpation, there is no CVA tenderness EXT: Normal ROM in all joints; non-tender to palpation; no edema; normal capillary refill; no cyanosis, no calf tenderness or swelling    SKIN: Normal color for age and race; warm; no rash NEURO: Moves all extremities equally, normal sensation diffusely, cranial nerves II through XII intact, normal speech, normal gait PSYCH: The patient's mood and manner are appropriate. Grooming and personal hygiene are appropriate.  MEDICAL DECISION MAKING: Patient here with atypical chest pain and atypical  headache.  Doubt meningitis, encephalitis, stroke, intracranial hemorrhage.  I do not feel he needs head imaging.  As for his chest pain, I doubt that he has ACS, PE or dissection.  EKG shows no new ischemic abnormality or arrhythmia.  Will obtain chest x-ray.  Will treat headache with IM Toradol and Decadron.  ED PROGRESS: Patient reports feeling much better.  Chest x-ray unremarkable.  Will give outpatient PCP and neurology  follow-up if symptoms continue.  Recommended alternating Tylenol and Motrin at home as needed for pain.   At this time, I do not feel there is any life-threatening condition present. I have reviewed and discussed all results (EKG, imaging, lab, urine as appropriate) and exam findings with patient/family. I have reviewed nursing notes and appropriate previous records.  I feel the patient is safe to be discharged home without further emergent workup and can continue workup as an outpatient as needed. Discussed usual and customary return precautions. Patient/family verbalize understanding and are comfortable with this plan.  Outpatient follow-up has been provided if needed. All questions have been answered.     EKG Interpretation  Date/Time:  Friday Feb 11 2018 01:26:14 EDT Ventricular Rate:  58 PR Interval:  156 QRS Duration: 104 QT Interval:  406 QTC Calculation: 398 R Axis:   52 Text Interpretation:  Sinus bradycardia with sinus arrhythmia Inferior infarct , age undetermined Abnormal ECG No significant change since last tracing Reconfirmed by Starsky Nanna, Baxter Hire [phone]) on 02/11/2018 1:32:11 AM          Boss Danielsen, Layla Maw, DO 02/11/18 2956

## 2018-02-11 NOTE — Discharge Instructions (Signed)
You may alternate Tylenol 1000 mg every 6 hours as needed for pain and Ibuprofen 800 mg every 8 hours as needed for pain.  Please take Ibuprofen with food. ° ° ° °To find a primary care or specialty doctor please call [phone] or [phone] to access "[facility] Find a Doctor Service." ° °You may also go on the [facility] website at [email or web address] ° °There are also multiple [facility], [facility], [facility] and [facility] practices throughout the [place] that are frequently accepting new patients. You may find a clinic that is close to your home and contact them. ° °[facility] -  °[address] °[place] [address] °[phone] ° ° °[facility] -  °[address] °[place] [address] °[phone] ° ° °[facility] - °[address]  °[place] [address] °[phone] ° ° °

## 2018-02-24 ENCOUNTER — Other Ambulatory Visit: Payer: Self-pay | Admitting: Family Medicine

## 2018-02-24 DIAGNOSIS — N5089 Other specified disorders of the male genital organs: Secondary | ICD-10-CM

## 2018-03-03 ENCOUNTER — Other Ambulatory Visit: Payer: Self-pay

## 2018-12-07 IMAGING — DX DG CHEST 2V
2 series · 2 of 2 positions shown · non-contrast
Comparison: None.

CLINICAL DATA: Chest pain

EXAM:
CHEST - 2 VIEW

[chest pa]
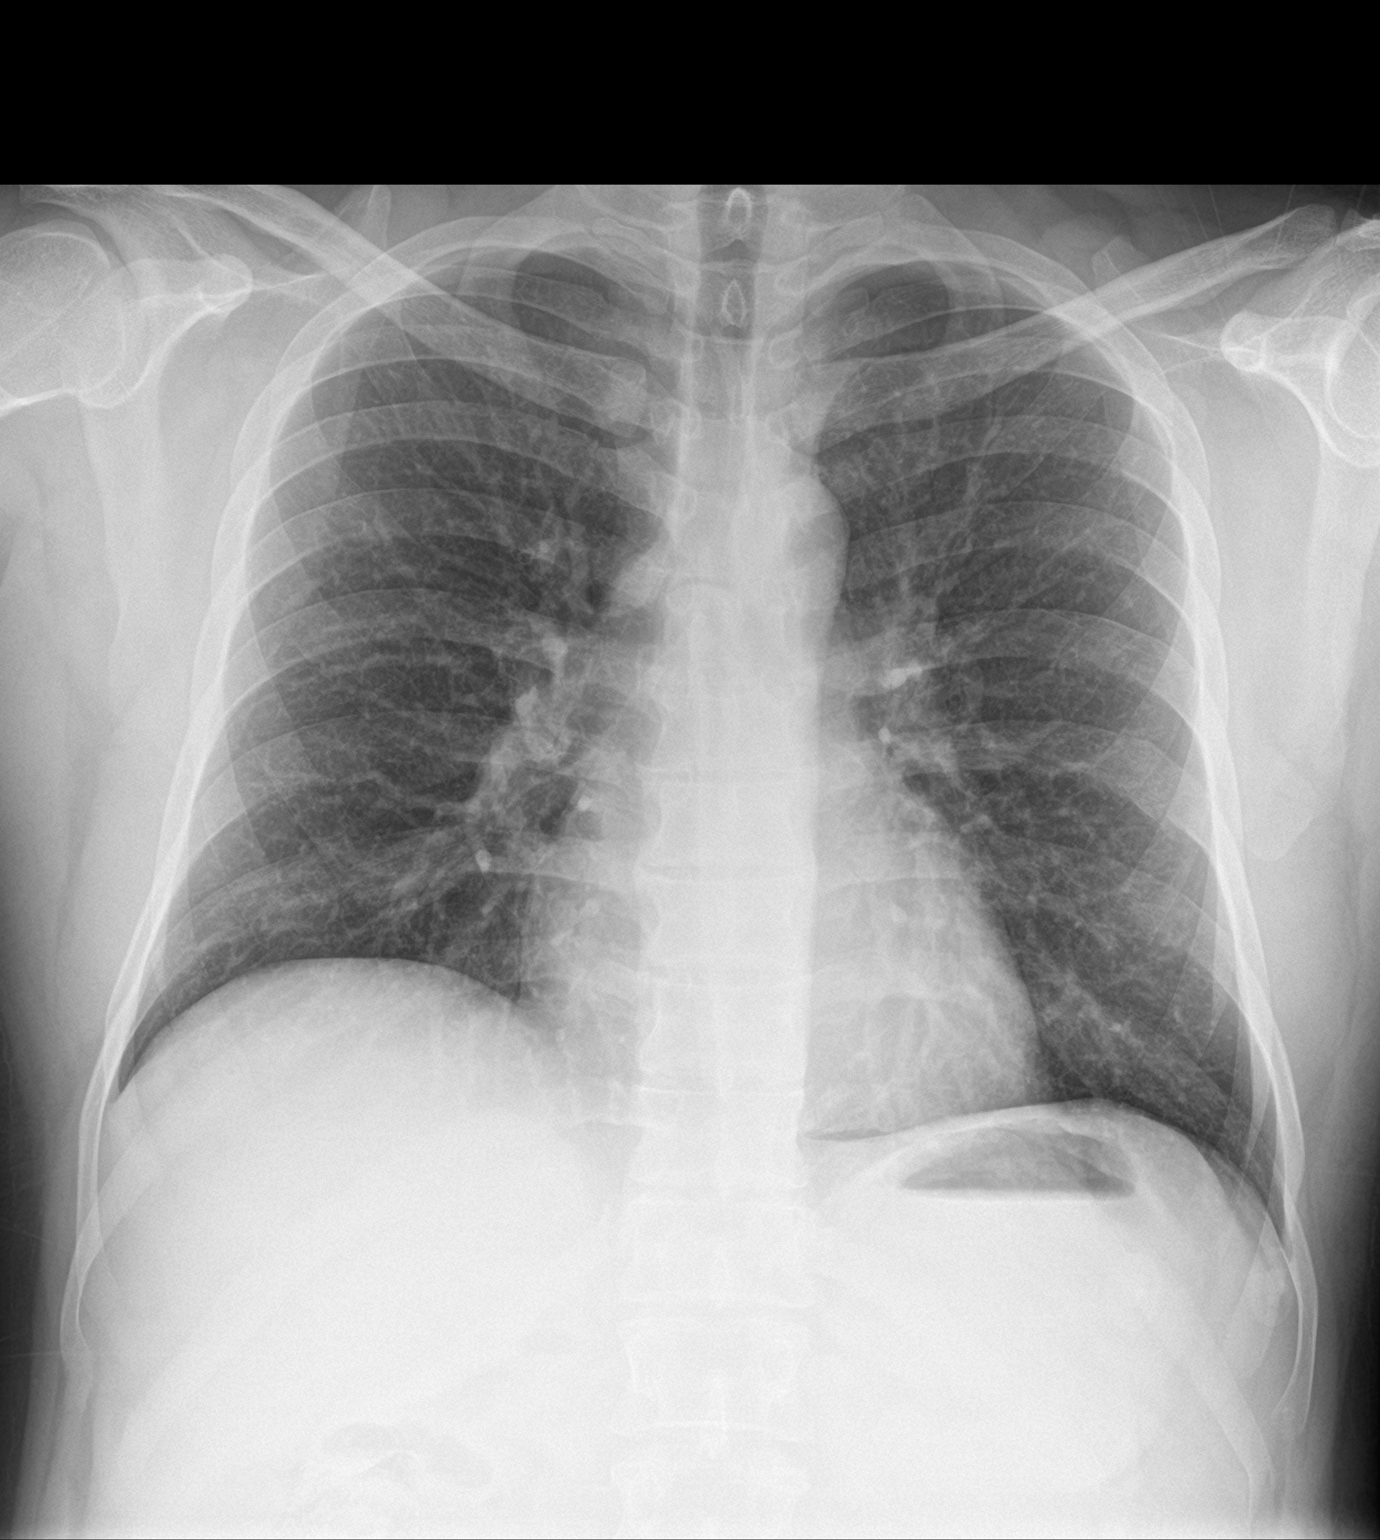

[chest lat]
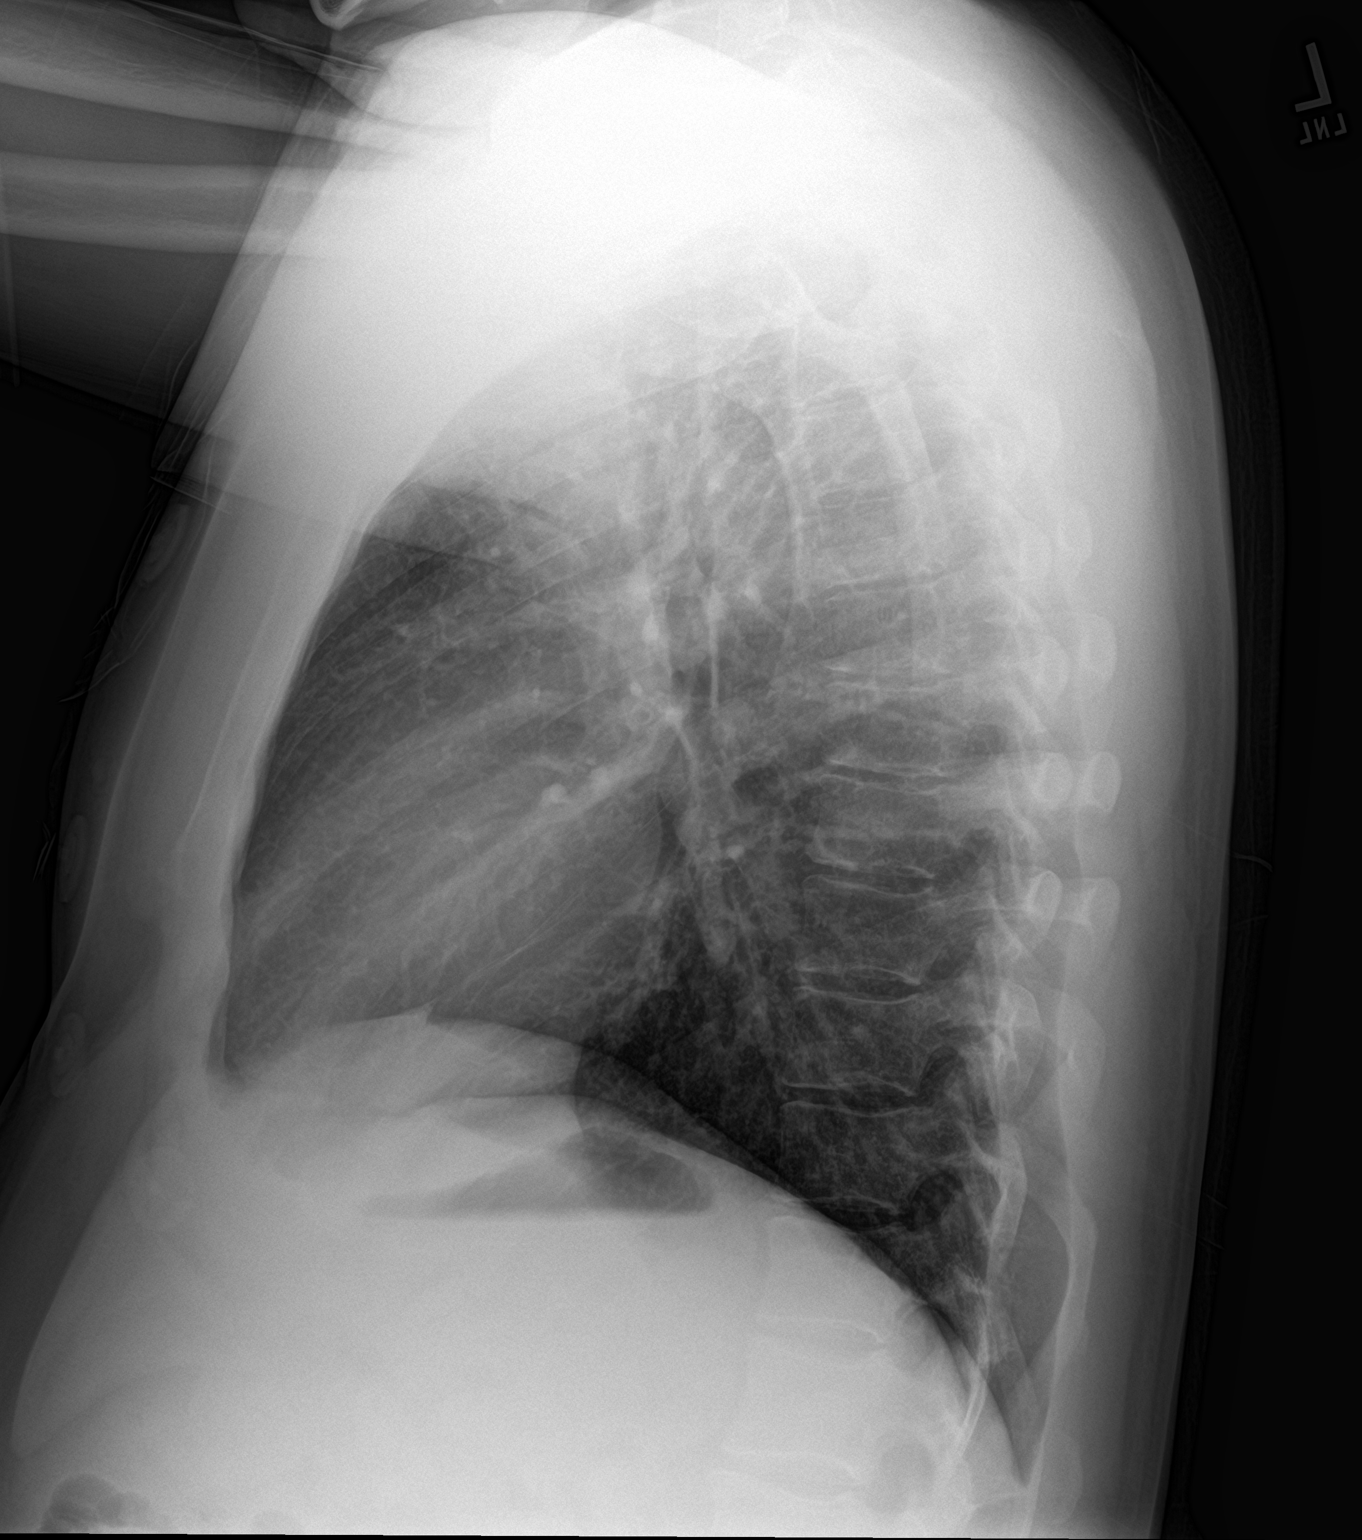

[2 of 2 positions shown; findings below may reference images not displayed]

FINDINGS: The heart size and mediastinal contours are within normal limits.
Both lungs are clear. The visualized skeletal structures are
unremarkable.
IMPRESSION: No active cardiopulmonary disease.

## 2019-01-04 IMAGING — CR DG CHEST 2V
2 series · 2 of 2 positions shown · non-contrast
Comparison: 01/14/2018

CLINICAL DATA: Left upper chest pain

EXAM:
CHEST - 2 VIEW

[w chest pa]
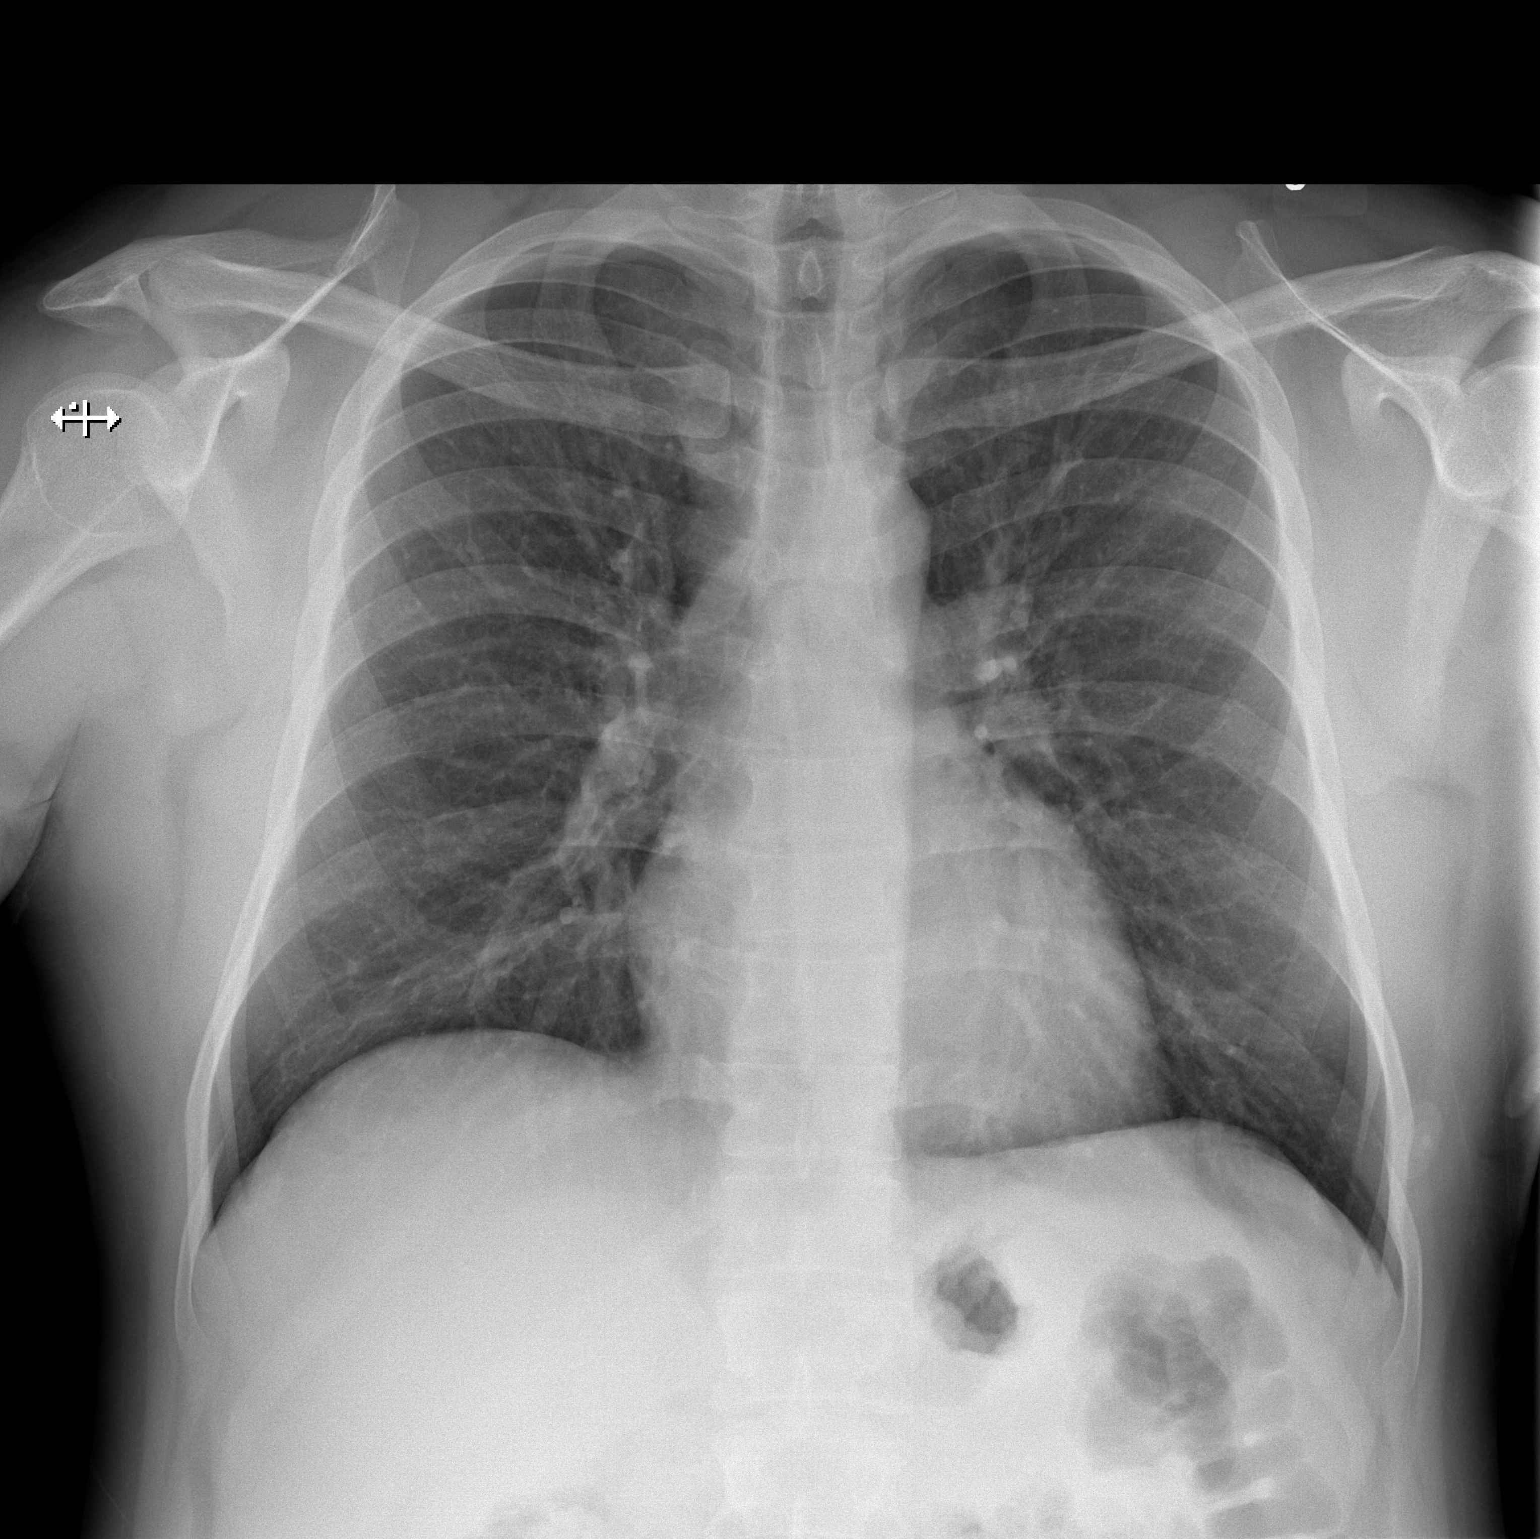

[w chest lat]
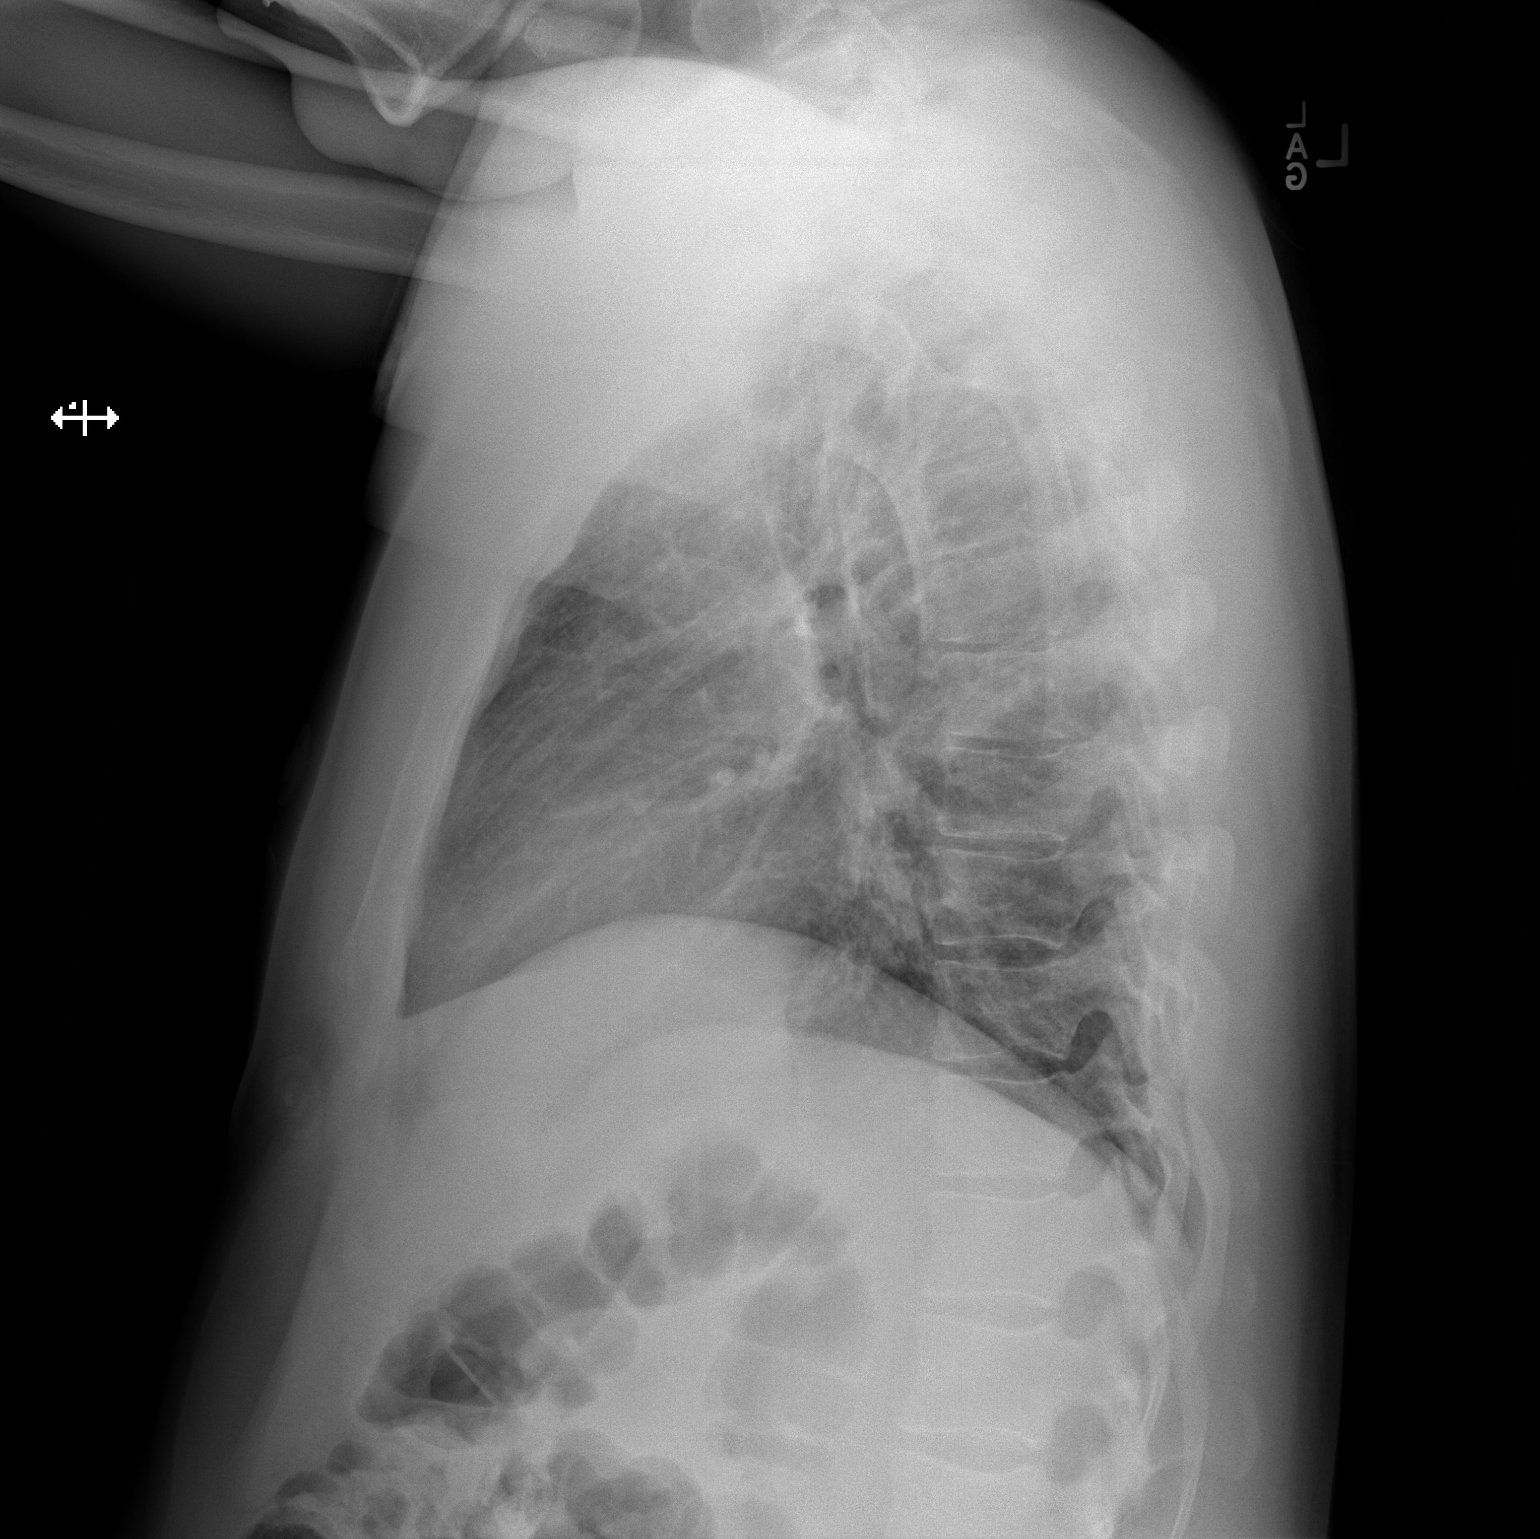

[2 of 2 positions shown; findings below may reference images not displayed]

FINDINGS: The heart size and mediastinal contours are within normal limits.
Both lungs are clear. The visualized skeletal structures are
unremarkable.
IMPRESSION: No active cardiopulmonary disease.

## 2019-04-10 ENCOUNTER — Telehealth: Payer: Self-pay | Admitting: Registered Nurse

## 2019-04-10 NOTE — Telephone Encounter (Signed)
LVM to reschedule appt due to provider sick

## 2019-04-11 ENCOUNTER — Ambulatory Visit: Payer: Self-pay | Admitting: Registered Nurse

## 2019-05-02 ENCOUNTER — Ambulatory Visit: Payer: Self-pay | Admitting: Registered Nurse

## 2019-05-03 ENCOUNTER — Encounter: Payer: Self-pay | Admitting: Registered Nurse

## 2024-01-05 ENCOUNTER — Ambulatory Visit (HOSPITAL_COMMUNITY)
Admission: EM | Admit: 2024-01-05 | Discharge: 2024-01-05 | Disposition: A | Discharge status: Home / Self Care | Payer: Self-pay | Attending: Emergency Medicine | Admitting: Emergency Medicine

## 2024-01-05 ENCOUNTER — Encounter (HOSPITAL_COMMUNITY): Payer: Self-pay

## 2024-01-05 DIAGNOSIS — H66002 Acute suppurative otitis media without spontaneous rupture of ear drum, left ear: Secondary | ICD-10-CM

## 2024-01-05 DIAGNOSIS — J301 Allergic rhinitis due to pollen: Secondary | ICD-10-CM

## 2024-01-05 DIAGNOSIS — H1013 Acute atopic conjunctivitis, bilateral: Secondary | ICD-10-CM

## 2024-01-05 MED ORDER — OLOPATADINE HCL 0.1 % OP SOLN: 1.0000 [drp] | Freq: Two times a day (BID) | OPHTHALMIC | 0 refills | Status: AC

## 2024-01-05 MED ORDER — FLUTICASONE PROPIONATE 50 MCG/ACT NA SUSP: 2.0000 | Freq: Every day | NASAL | 0 refills | Status: AC

## 2024-01-05 MED ORDER — AMOXICILLIN-POT CLAVULANATE 875-125 MG PO TABS: 1.0000 | ORAL_TABLET | Freq: Two times a day (BID) | ORAL | 0 refills | Status: AC

## 2024-01-05 NOTE — Discharge Instructions (Addendum)
 Take the antibiotics twice daily for the next 7 days.  Take them with food to prevent gastrointestinal upset.  For any pain you can alternate between 800 mg of ibuprofen and 500 mg of Tylenol every 4-6 hours.  For your seasonal allergy symptoms you can use the nasal spray daily as needed for congestion and runny nose.  You can also use the eyedrops up to 2 times daily as needed for any itching.  If you would like to consider allergy testing and injections, please follow-up with an allergy center.  Return to clinic for any new or urgent symptoms.

## 2024-01-05 NOTE — ED Triage Notes (Signed)
 Sinus congestion and left ear pain x 3 days.

## 2024-01-05 NOTE — ED Provider Notes (Signed)
 [facility]    CSN: [id number] Arrival date & time: 01/05/24  1609      History   Chief Complaint Chief Complaint  Patient presents with   Nasal Congestion   Ear Pain    HPI Oscar Lee is a 33 y.o. male.   Patient presents to clinic over concerns of nasal congestion, rhinorrhea, itchy eyes and left ear pain.  Left ear pain has been present for the past 12 days.  He has not had any drainage from the ears or fevers.  Denies cough, fever or bodyaches.  Nasal congestion and itchy eyes have been ongoing with the pollen, patient reports he struggles from seasonal allergies.  Has been taking a daily antihistamine for this without much improvement.  Itchy eyes will be intermittent.  Denies any drainage or discharge.  Without vision changes.  The history is provided by the patient and medical records.    History reviewed. No pertinent past medical history.  There are no active problems to display for this patient.   History reviewed. No pertinent surgical history.     Home Medications    Prior to Admission medications   Medication Sig Start Date End Date Taking? Authorizing Provider  amoxicillin-clavulanate (AUGMENTIN) 875-125 MG tablet Take 1 tablet by mouth every 12 (twelve) hours. 01/05/24  Yes Rinaldo Ratel, [place] N, FNP  fluticasone [facility]) 50 MCG/ACT nasal spray Place 2 sprays into both nostrils daily. 01/05/24 02/04/24 Yes Rinaldo Ratel, [place] N, FNP  olopatadine (PATADAY) 0.1 % ophthalmic solution Place 1 drop into both eyes 2 (two) times daily. 01/05/24  Yes Rinaldo Ratel, [place] N, FNP  bacitracin ointment Apply 1 application topically 2 (two) times daily. 02/01/15   Trixie Dredge, PA-C  diclofenac sodium (VOLTAREN) 1 % GEL Apply 2 g topically 4 (four) times daily. 12/09/17   Georgiana Shore, PA-C  diphenhydrAMINE (BENADRYL) 25 mg capsule Take 1 capsule (25 mg total) by mouth every 6 (six) hours as needed for itching. 05/17/12 05/27/12  Chatten, Katherine Basset, NP  ibuprofen  (ADVIL,MOTRIN) 600 MG tablet Take 1 tablet (600 mg total) by mouth every 6 (six) hours as needed. 12/09/17   Georgiana Shore PA-C    Family History History reviewed. No pertinent family history.  Social History Social History   Tobacco Use   Smoking status: Never   Smokeless tobacco: Never  Substance Use Topics   Alcohol use: Yes    Comment: socially   Drug use: No     Allergies   Patient has no known allergies.   Review of Systems Review of Systems  Per HPI  Physical Exam Triage Vital Signs ED Triage Vitals [01/05/24 1816]  Encounter Vitals Group     BP (!) 146/82     Systolic BP Percentile      Diastolic BP Percentile      Pulse Rate 81     Resp 16     Temp 98 F (36.7 C)     Temp Source Oral     SpO2 96 %     Weight      Height      Head Circumference      Peak Flow      Pain Score      Pain Loc      Pain Education      Exclude from Growth Chart    No data found.  Updated Vital Signs BP (!) 146/82 (BP Location: Left Arm)   Pulse 81   Temp 98 F (36.7  C) (Oral)   Resp 16   SpO2 96%   Visual Acuity Right Eye Distance:   Left Eye Distance:   Bilateral Distance:    Right Eye Near:   Left Eye Near:    Bilateral Near:     Physical Exam Vitals and nursing note reviewed.  Constitutional:      Appearance: Normal appearance.  HENT:     Head: Normocephalic and atraumatic.     Right Ear: Tympanic membrane, ear canal and external ear normal.     Left Ear: Tympanic membrane is erythematous and bulging.     Ears:     Comments: Left tympanic membrane is erythematous and bulging, consistent with otitis media.      Nose: Congestion and rhinorrhea present.     Mouth/Throat:     Mouth: Mucous membranes are moist.     Pharynx: Posterior oropharyngeal erythema present.  Eyes:     Conjunctiva/sclera: Conjunctivae normal.  Cardiovascular:     Rate and Rhythm: Normal rate.  Pulmonary:     Effort: Pulmonary effort is normal. No respiratory distress.   Skin:    General: Skin is warm and dry.  Neurological:     General: No focal deficit present.     Mental Status: He is alert.  Psychiatric:        Behavior: Behavior is cooperative.      UC Treatments / Results  Labs (all labs ordered are listed, but only abnormal results are displayed) Labs Reviewed - No data to display  EKG   Radiology No results found.  Procedures Procedures (including critical care time)  Medications Ordered in UC Medications - No data to display  Initial Impression / Assessment and Plan / UC Course  I have reviewed the triage vital signs and the nursing notes.  Pertinent labs & imaging results that were available during my care of the patient were reviewed by me and considered in my medical decision making (see chart for details).   Vitals and triage reviewed, patient is hemodynamically stable.  Left tympanic membrane is erythematous and bulging, consistent with otitis media.  Will cover with Augmentin.    Without sinus tenderness, afebrile.  Augmentin will cover for acute bacterial sinusitis regardless.  Congestion and rhinorrhea present, symptoms consistent with allergic rhinitis and allergic conjunctivitis.  Symptomatic management for allergies discussed.  Plan of care, follow-up care return precautions given, no questions at this time.    Final Clinical Impressions(s) / UC Diagnoses   Final diagnoses:  Seasonal allergic rhinitis due to pollen  Allergic conjunctivitis of both eyes  Acute suppurative otitis media of left ear without spontaneous rupture of tympanic membrane, recurrence not specified     Discharge Instructions      Take the antibiotics twice daily for the next 7 days.  Take them with food to prevent gastrointestinal upset.  For any pain you can alternate between 800 mg of ibuprofen and 500 mg of Tylenol every 4-6 hours.  For your seasonal allergy symptoms you can use the nasal spray daily as needed for congestion and runny  nose.  You can also use the eyedrops up to 2 times daily as needed for any itching.  If you would like to consider allergy testing and injections, please follow-up with an allergy center.  Return to clinic for any new or urgent symptoms.     ED Prescriptions     Medication Sig Dispense Auth. Provider   amoxicillin-clavulanate (AUGMENTIN) 875-125 MG tablet Take 1 tablet by mouth  every 12 (twelve) hours. 14 tablet Rinaldo Ratel, [place] N, FNP   fluticasone [facility]) 50 MCG/ACT nasal spray Place 2 sprays into both nostrils daily. 9.9 mL Rinaldo Ratel, [place] N, FNP   olopatadine (PATADAY) 0.1 % ophthalmic solution Place 1 drop into both eyes 2 (two) times daily. 5 mL Saylee Sherrill, [place] N, FNP      PDMP not reviewed this encounter.   Rinaldo Ratel [place] N, [place] 01/05/24 [phone]
